# Patient Record
Sex: Male | Born: 1985 | Race: White | Hispanic: No | Marital: Single | Smoking: Never smoker
Health system: Southern US, Community
[De-identification: ages and names within clinical notes are randomized; demographics above are authoritative.]

## PROBLEM LIST (undated history)

## (undated) DIAGNOSIS — F419 Anxiety disorder, unspecified: Secondary | ICD-10-CM

## (undated) DIAGNOSIS — K5900 Constipation, unspecified: Secondary | ICD-10-CM

## (undated) DIAGNOSIS — F99 Mental disorder, not otherwise specified: Secondary | ICD-10-CM

## (undated) DIAGNOSIS — F79 Unspecified intellectual disabilities: Secondary | ICD-10-CM

## (undated) DIAGNOSIS — I10 Essential (primary) hypertension: Secondary | ICD-10-CM

## (undated) DIAGNOSIS — R0600 Dyspnea, unspecified: Secondary | ICD-10-CM

## (undated) DIAGNOSIS — K117 Disturbances of salivary secretion: Secondary | ICD-10-CM

## (undated) DIAGNOSIS — F988 Other specified behavioral and emotional disorders with onset usually occurring in childhood and adolescence: Secondary | ICD-10-CM

## (undated) DIAGNOSIS — R569 Unspecified convulsions: Secondary | ICD-10-CM

## (undated) DIAGNOSIS — F32A Depression, unspecified: Secondary | ICD-10-CM

## (undated) DIAGNOSIS — F410 Panic disorder [episodic paroxysmal anxiety] without agoraphobia: Secondary | ICD-10-CM

## (undated) DIAGNOSIS — F329 Major depressive disorder, single episode, unspecified: Secondary | ICD-10-CM

## (undated) HISTORY — PX: TONSILLECTOMY AND ADENOIDECTOMY: SHX28

## (undated) HISTORY — PX: TESTICLE SURGERY: SHX794

## (undated) HISTORY — PX: NO PAST SURGERIES: SHX2092

## (undated) HISTORY — PX: TONSILLECTOMY: SUR1361

## (undated) HISTORY — PX: BRAIN SURGERY: SHX531

---
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
## 2012-02-12 ENCOUNTER — Encounter (HOSPITAL_COMMUNITY)
Admission: RE | Admit: 2012-02-12 | Discharge: 2012-02-12 | Disposition: A | Discharge status: Home / Self Care | Payer: Medicaid Other | Source: Ambulatory Visit | Attending: Dentistry | Admitting: Dentistry

## 2012-02-12 ENCOUNTER — Encounter (HOSPITAL_COMMUNITY): Payer: Self-pay

## 2012-02-12 ENCOUNTER — Encounter (HOSPITAL_COMMUNITY): Payer: Self-pay | Admitting: Pharmacy Technician

## 2012-02-12 HISTORY — DX: Mental disorder, not otherwise specified: F99

## 2012-02-12 NOTE — Pre-Procedure Instructions (Signed)
22 Cameron Ibarra  02/12/2012   Your procedure is scheduled on:   Friday  02/22/12     Report to [facility] at 530 AM.  Call this number if you have problems the morning of surgery: [phone]   Remember:   Do not eat food:After Midnight.  May have clear liquids: up to 4 Hours before arrival.  Clear liquids include soda, tea, black coffee, apple or grape juice, broth.  Take these medicines the morning of surgery with A SIP OF WATER: CONCERTA, BUSPAR, DEPAKOTE,     Do not wear jewelry, make-up or nail polish.  Do not wear lotions, powders, or perfumes. You may wear deodorant.  Do not shave 48 hours prior to surgery.  Do not bring valuables to the hospital.  Contacts, dentures or bridgework may not be worn into surgery.  Leave suitcase in the car. After surgery it may be brought to your room.  For patients admitted to the hospital, checkout time is 11:00 AM the day of discharge.   Patients discharged the day of surgery will not be allowed to drive home.  Name and phone number of your driver: MOTHER   Special Instructions: CHG Shower Use Special Wash: 1/2 bottle night before surgery and 1/2 bottle morning of surgery.   Please read over the following fact sheets that you were given: Pain Booklet, Surgical Site Infection Prevention and Anesthesia Post-op Instructions

## 2012-02-13 NOTE — Consult Note (Signed)
Anesthesia:  Patient is a 26 year old male scheduled for dental restoration on 02/22/12.  His history includes mental retardation, ADHD, anxiety disorder, absence seizures, unspecified sleep disorder.  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 He was evaluated by Dr. Abelardo Diesel at [facility] and felt medically stable for this procedure.  Labs with sedation were recommended due to his history of being uncooperative.    Meds include Buspar, clonidine, Depakote, Concerta, and trazodone.  Dr. Magdalene Patricia also prescribed Xanax to be taken one hour before this procedure.  Will order a CBC, BMET for the day of surgery.  Anticipate labs will need to be drawn in holding once sedated.  Dr. Magdalene Patricia would also like for a lipid profile to be drawn with his pre-operative labs.  Will see if the nursing staff can work on getting an official order for this.

## 2012-02-14 ENCOUNTER — Encounter (HOSPITAL_COMMUNITY): Payer: Self-pay

## 2012-02-22 ENCOUNTER — Encounter (HOSPITAL_COMMUNITY): Payer: Self-pay | Admitting: Vascular Surgery

## 2012-02-22 ENCOUNTER — Ambulatory Visit (HOSPITAL_COMMUNITY): Payer: Medicaid Other | Admitting: Vascular Surgery

## 2012-02-22 ENCOUNTER — Encounter (HOSPITAL_COMMUNITY)
Admission: RE | Disposition: A | Discharge status: Home / Self Care | Payer: Self-pay | Source: Ambulatory Visit | Attending: Dentistry

## 2012-02-22 ENCOUNTER — Ambulatory Visit (HOSPITAL_COMMUNITY)
Admission: RE | Admit: 2012-02-22 | Discharge: 2012-02-22 | Disposition: A | Discharge status: Home / Self Care | Payer: Medicaid Other | Source: Ambulatory Visit | Attending: Dentistry | Admitting: Dentistry

## 2012-02-22 DIAGNOSIS — I10 Essential (primary) hypertension: Secondary | ICD-10-CM | POA: Insufficient documentation

## 2012-02-22 DIAGNOSIS — F79 Unspecified intellectual disabilities: Secondary | ICD-10-CM | POA: Insufficient documentation

## 2012-02-22 DIAGNOSIS — K029 Dental caries, unspecified: Secondary | ICD-10-CM | POA: Insufficient documentation

## 2012-02-22 DIAGNOSIS — R569 Unspecified convulsions: Secondary | ICD-10-CM | POA: Insufficient documentation

## 2012-02-22 DIAGNOSIS — F88 Other disorders of psychological development: Secondary | ICD-10-CM | POA: Insufficient documentation

## 2012-02-22 HISTORY — PX: TOOTH EXTRACTION: SHX859

## 2012-02-22 SURGERY — DENTAL RESTORATION/EXTRACTIONS
Anesthesia: General | Site: Mouth | Wound class: Clean Contaminated

## 2012-02-22 MED ORDER — FENTANYL CITRATE 0.05 MG/ML IJ SOLN
INTRAMUSCULAR | Status: DC | PRN
  Administered 2012-02-22: 100 ug via INTRAVENOUS
  Administered 2012-02-22: 50 ug via INTRAVENOUS

## 2012-02-22 MED ORDER — ONDANSETRON HCL 4 MG/2ML IJ SOLN
INTRAMUSCULAR | Status: DC | PRN
  Administered 2012-02-22: 4 mg via INTRAVENOUS

## 2012-02-22 MED ORDER — LIDOCAINE-EPINEPHRINE 2 %-1:100000 IJ SOLN
INTRAMUSCULAR | Status: DC | PRN
  Administered 2012-02-22: 20 mL

## 2012-02-22 MED ORDER — CHLORHEXIDINE GLUCONATE 0.12 % MT SOLN
473.0000 mL | OROMUCOSAL | Status: DC
  Filled 2012-02-22: qty 473

## 2012-02-22 MED ORDER — PROPOFOL 10 MG/ML IV EMUL
INTRAVENOUS | Status: DC | PRN
  Administered 2012-02-22: 200 mg via INTRAVENOUS

## 2012-02-22 MED ORDER — HEMOSTATIC AGENTS (NO CHARGE) OPTIME
TOPICAL | Status: DC | PRN
  Administered 2012-02-22: 1 via TOPICAL

## 2012-02-22 MED ORDER — OXYMETAZOLINE HCL 0.05 % NA SOLN
NASAL | Status: DC | PRN
  Administered 2012-02-22: 1 via NASAL

## 2012-02-22 MED ORDER — MIDAZOLAM HCL 2 MG/ML PO SYRP
20.0000 mg | ORAL_SOLUTION | Freq: Once | ORAL | Status: AC
  Administered 2012-02-22: 20 mg via ORAL

## 2012-02-22 MED ORDER — LACTATED RINGERS IV SOLN
INTRAVENOUS | Status: DC | PRN
  Administered 2012-02-22 (×2): via INTRAVENOUS

## 2012-02-22 MED ORDER — SUCCINYLCHOLINE CHLORIDE 20 MG/ML IJ SOLN
INTRAMUSCULAR | Status: DC | PRN
  Administered 2012-02-22: 120 mg via INTRAVENOUS

## 2012-02-22 MED ORDER — MIDAZOLAM HCL 2 MG/ML PO SYRP
ORAL_SOLUTION | ORAL | Status: AC
  Filled 2012-02-22: qty 10

## 2012-02-22 MED ORDER — DROPERIDOL 2.5 MG/ML IJ SOLN
0.6250 mg | INTRAMUSCULAR | Status: DC | PRN
  Administered 2012-02-22: 0.625 mg via INTRAVENOUS

## 2012-02-22 MED ORDER — GLYCOPYRROLATE 0.2 MG/ML IJ SOLN
INTRAMUSCULAR | Status: DC | PRN
  Administered 2012-02-22: 0.4 mg via INTRAVENOUS

## 2012-02-22 MED ORDER — KETAMINE HCL 100 MG/ML IJ SOLN
INTRAMUSCULAR | Status: AC
  Filled 2012-02-22: qty 1

## 2012-02-22 MED ORDER — NEOSTIGMINE METHYLSULFATE 1 MG/ML IJ SOLN
INTRAMUSCULAR | Status: DC | PRN
  Administered 2012-02-22: 3 mg via INTRAVENOUS

## 2012-02-22 MED ORDER — ROCURONIUM BROMIDE 100 MG/10ML IV SOLN
INTRAVENOUS | Status: DC | PRN
  Administered 2012-02-22: 10 mg via INTRAVENOUS
  Administered 2012-02-22: 30 mg via INTRAVENOUS

## 2012-02-22 MED ORDER — HYDROMORPHONE HCL PF 1 MG/ML IJ SOLN
0.2500 mg | INTRAMUSCULAR | Status: DC | PRN
  Administered 2012-02-22 (×2): 0.25 mg via INTRAVENOUS

## 2012-02-22 SURGICAL SUPPLY — 40 items
BLADE SURG 15 STRL LF DISP TIS (BLADE) IMPLANT
BLADE SURG 15 STRL SS (BLADE)
CANISTER SUCTION 2500CC (MISCELLANEOUS) ×2 IMPLANT
CLOTH BEACON ORANGE TIMEOUT ST (SAFETY) ×2 IMPLANT
CONT SPEC 4OZ CLIKSEAL STRL BL (MISCELLANEOUS) IMPLANT
COVER PROBE W GEL 5X96 (DRAPES) ×2 IMPLANT
COVER SURGICAL LIGHT HANDLE (MISCELLANEOUS) ×2 IMPLANT
COVER TABLE BACK 60X90 (DRAPES) ×2 IMPLANT
DECANTER SPIKE VIAL GLASS SM (MISCELLANEOUS) IMPLANT
DRAPE PROXIMA HALF (DRAPES) ×2 IMPLANT
ELECT COATED BLADE 2.86 ST (ELECTRODE) IMPLANT
ELECT REM PT RETURN 9FT ADLT (ELECTROSURGICAL) ×2
ELECTRODE REM PT RTRN 9FT ADLT (ELECTROSURGICAL) ×1 IMPLANT
GAUZE PACKING FOLDED 2  STR (GAUZE/BANDAGES/DRESSINGS) ×1
GAUZE PACKING FOLDED 2 STR (GAUZE/BANDAGES/DRESSINGS) ×1 IMPLANT
GAUZE SPONGE 2X2 8PLY STRL LF (GAUZE/BANDAGES/DRESSINGS) IMPLANT
GAUZE SPONGE 4X4 16PLY XRAY LF (GAUZE/BANDAGES/DRESSINGS) ×2 IMPLANT
GLOVE BIO SURGEON STRL SZ 6.5 (GLOVE) ×2 IMPLANT
GLOVE BIO SURGEON STRL SZ7.5 (GLOVE) ×2 IMPLANT
GOWN STRL NON-REIN LRG LVL3 (GOWN DISPOSABLE) ×4 IMPLANT
KIT BASIN OR (CUSTOM PROCEDURE TRAY) ×2 IMPLANT
KIT ROOM TURNOVER OR (KITS) ×2 IMPLANT
NEEDLE 27GAX1X1/2 (NEEDLE) IMPLANT
NEEDLE FILTER BLUNT 18X 1/2SAF (NEEDLE)
NEEDLE FILTER BLUNT 18X1 1/2 (NEEDLE) IMPLANT
NEEDLE HYPO 25GX1X1/2 BEV (NEEDLE) ×2 IMPLANT
PAD ARMBOARD 7.5X6 YLW CONV (MISCELLANEOUS) ×2 IMPLANT
PENCIL BUTTON HOLSTER BLD 10FT (ELECTRODE) ×2 IMPLANT
SPONGE GAUZE 2X2 STER 10/PKG (GAUZE/BANDAGES/DRESSINGS)
SPONGE GAUZE 4X4 12PLY (GAUZE/BANDAGES/DRESSINGS) IMPLANT
SPONGE SURGIFOAM ABS GEL 12-7 (HEMOSTASIS) ×2 IMPLANT
SPONGE SURGIFOAM ABS GEL SZ50 (HEMOSTASIS) IMPLANT
SUT CHROMIC 3 0 PS 2 (SUTURE) IMPLANT
SYR CONTROL 10ML LL (SYRINGE) ×2 IMPLANT
TOOTHBRUSH ADULT (PERSONAL CARE ITEMS) IMPLANT
TOWEL OR 17X24 6PK STRL BLUE (TOWEL DISPOSABLE) IMPLANT
TOWEL OR 17X26 10 PK STRL BLUE (TOWEL DISPOSABLE) ×2 IMPLANT
TUBE CONNECTING 12X1/4 (SUCTIONS) ×2 IMPLANT
WATER STERILE IRR 1000ML POUR (IV SOLUTION) ×4 IMPLANT
YANKAUER SUCT BULB TIP NO VENT (SUCTIONS) ×2 IMPLANT

## 2012-02-22 NOTE — H&P (Cosign Needed)
Cameron Ibarra is an 26 y.o. male.   Chief Complaint: Dental Caries HPI: Approved for surgery   Past Medical History  Diagnosis Date  . Mental disorder     add hyperactive    Past Surgical History  Procedure Date  . No past surgeries   . Brain surgery     CRANI RESHAPING AGE 53 MONTHS  . Testicle surgery   . Tonsillectomy and adenoidectomy     24 YRS  OLD   ONLY ADENOIDS    No family history on file. Social History:  does not have a smoking history on file. He has never used smokeless tobacco. He reports that he does not drink alcohol or use illicit drugs.  Allergies:  Allergies  Allergen Reactions  . Codeine Nausea And Vomiting    Medications Prior to Admission  Medication Dose Route Frequency Provider Last Rate Last Dose  . midazolam (VERSED) 2 MG/ML syrup 20 mg  20 mg Oral Once Remonia Richter, MD   20 mg at 02/22/12 0708  . midazolam (VERSED) 2 MG/ML syrup            Medications Prior to Admission  Medication Sig Dispense Refill  . busPIRone (BUSPAR) 10 MG tablet Take 20 mg by mouth 3 (three) times daily.      . cloNIDine (CATAPRES) 0.1 MG tablet Take 0.1 mg by mouth at bedtime.      . divalproex (DEPAKOTE) 250 MG DR tablet Take 250 mg by mouth 4 (four) times daily.      . methylphenidate (CONCERTA) 36 MG CR tablet Take 72 mg by mouth every morning.      . traZODone (DESYREL) 100 MG tablet Take 100 mg by mouth at bedtime.        No results found for this or any previous visit (from the past 48 hour(s)). No results found.  ROS  Blood pressure 110/71, pulse 68, temperature 98.3 F (36.8 C), temperature source Oral, resp. rate 68, SpO2 99.00%. Physical Exam   Assessment/Plan Approved for surgery  Cameron Ibarra,Cameron Ibarra 02/22/2012, 7:11 AM

## 2012-02-22 NOTE — Preoperative (Signed)
Beta Blockers   Reason not to administer Beta Blockers:Not Applicable 

## 2012-02-22 NOTE — Brief Op Note (Signed)
02/22/2012  11:57 AM  PATIENT:  Cameron Ibarra  26 y.o. male  PRE-OPERATIVE DIAGNOSIS:  DENTAL CARIES  POST-OPERATIVE DIAGNOSIS:  DENTAL CARIES  PROCEDURE:  Procedure(s) (LRB): DENTAL RESTORATION/EXTRACTIONS (N/A)  SURGEON:  Surgeon(s) and Role:    * Esaw Dace., DDS - Primary  PHYSICIAN ASSISTANT:   ASSISTANTS: none   ANESTHESIA:   general  EBL:  Total I/O In: 1600 [I.V.:1600] Out: -   BLOOD ADMINISTERED:none  DRAINS: none   LOCAL MEDICATIONS USED:  LIDOCAINE   SPECIMEN:  No Specimen  DISPOSITION OF SPECIMEN:  N/A  COUNTS:  YES  TOURNIQUET:  * No tourniquets in log *  DICTATION: .Other Dictation: Dictation Number   PLAN OF CARE: Discharge to home after PACU  PATIENT DISPOSITION:  PACU - hemodynamically stable.   Delay start of Pharmacological VTE agent (>24hrs) due to surgical blood loss or risk of bleeding: not applicable

## 2012-02-22 NOTE — H&P (Signed)
H&P Reviewed. Pt re-examined. No changes.  

## 2012-02-22 NOTE — Progress Notes (Signed)
Call to Dr. Krista Blue- reported med. History, medicine Profile, order rec'd to cancel request for CBC, BMET  But to continue with lipid panel if retrievable after pt. Has been given sedation. Order rec'd to administer Versed.

## 2012-02-22 NOTE — Brief Op Note (Signed)
02/22/2012  11:28 AM  PATIENT:  Cameron Ibarra  26 y.o. male  PRE-OPERATIVE DIAGNOSIS:  DENTAL CARIES  POST-OPERATIVE DIAGNOSIS:  DENTAL CARIES  PROCEDURE:  Procedure(s) (LRB): DENTAL RESTORATION/EXTRACTIONS (N/A)  SURGEON:  Surgeon(s) and Role:    * Terrilyn Saver., DDS - Primary  PHYSICIAN ASSISTANT:   ASSISTANTS: Laqueta Carina   ANESTHESIA:   general  EBL:  Total I/O In: 1600 [I.V.:1600] Out: -   BLOOD ADMINISTERED:none  DRAINS: none   LOCAL MEDICATIONS USED:  LIDOCAINE   SPECIMEN:  No Specimen  DISPOSITION OF SPECIMEN:  N/A  COUNTS:  YES  TOURNIQUET:  * No tourniquets in log *  DICTATION: .Other Dictation: Dictation Number Dictation completed  PLAN OF CARE: Discharge to home after PACU  PATIENT DISPOSITION:  Short Stay   Delay start of Pharmacological VTE agent (>24hrs) due to surgical blood loss or risk of bleeding: no

## 2012-02-22 NOTE — Transfer of Care (Signed)
Immediate Anesthesia Transfer of Care Note  Patient: Cameron Ibarra  Procedure(s) Performed: Procedure(s) (LRB): DENTAL RESTORATION/EXTRACTIONS (N/A)  Patient Location: PACU  Anesthesia Type: General  Level of Consciousness: awake  Airway & Oxygen Therapy: Patient Spontanous Breathing and Patient connected to nasal cannula oxygen  Post-op Assessment: Report given to PACU RN and Post -op Vital signs reviewed and stable  Post vital signs: Reviewed  Complications: No apparent anesthesia complications

## 2012-02-22 NOTE — Anesthesia Preprocedure Evaluation (Addendum)
Anesthesia Evaluation  Patient identified by MRN, date of birth, ID band Patient awake    Reviewed: Allergy & Precautions, H&P , NPO status , Patient's Chart, lab work & pertinent test results  History of Anesthesia Complications Negative for: history of anesthetic complications  Airway   Neck ROM: Full    Dental  (+) Poor Dentition and Dental Advisory Given   Pulmonary  breath sounds clear to auscultation        Cardiovascular hypertension, Pt. on medications Rhythm:Regular Rate:Normal     Neuro/Psych Seizures -, Well Controlled,  PSYCHIATRIC DISORDERS    GI/Hepatic   Endo/Other    Renal/GU      Musculoskeletal   Abdominal   Peds  Hematology   Anesthesia Other Findings   Reproductive/Obstetrics                          Anesthesia Physical Anesthesia Plan  ASA: III  Anesthesia Plan: General   Post-op Pain Management:    Induction: Intravenous  Airway Management Planned: Nasal ETT  Additional Equipment:   Intra-op Plan:   Post-operative Plan: Extubation in OR  Informed Consent: I have reviewed the patients History and Physical, chart, labs and discussed the procedure including the risks, benefits and alternatives for the proposed anesthesia with the patient or authorized representative who has indicated his/her understanding and acceptance.   Dental advisory given  Plan Discussed with: CRNA, Anesthesiologist and Surgeon  Anesthesia Plan Comments:         Anesthesia Quick Evaluation

## 2012-02-22 NOTE — Anesthesia Postprocedure Evaluation (Signed)
Anesthesia Post Note  Patient: Cameron Ibarra  Procedure(s) Performed: Procedure(s) (LRB): DENTAL RESTORATION/EXTRACTIONS (N/A)  Anesthesia type: general  Patient location: PACU  Post pain: Pain level controlled  Post assessment: Patient's Cardiovascular Status Stable  Last Vitals:  Filed Vitals:   02/22/12 1243  BP:   Pulse: 96  Temp: 37.5 C  Resp: 21    Post vital signs: Reviewed and stable  Level of consciousness: sedated  Complications: No apparent anesthesia complications

## 2012-02-22 NOTE — Op Note (Signed)
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  Dental care provided in the operating room for medically necessary treatment.  OPERATION:  Full mouth rehabilitation including exam, x-rays, cleaning, operative and crowning bridge.  SURGEON:  Azucena Freed, DDS.  ASSISTANT:  Laqueta Carina and hospital staff.  ANESTHESIA:  General.  PROCEDURE:  The patient was brought in to the operating room and placed in the supine position.  General anesthesia was administered via nasal intubation.  The patient was prepped and draped in the usual manner for an intraoral general dentistry procedure.  The oropharynx was suctioned and a moistened posterior throat pack was placed.  A full intraoral exam including all hard and soft tissues was performed.  This was a recall exam.  A soft tissue exam reveals floor of the mouth, buccal mucosa, soft palate, hard palate, tongue, gingiva, and frenum attachments all within normal limits with regard to size, color, and consistency.  Hard tissue exam reveals tooth #1 missing, #2 through 15 present, #16 and 17 missing, #18 through 31 present, #32 missing.  A full mouth series of digital x-rays was taken, full mouth debridement was completed. Operative care was provided with a high-speed handpiece #557 and #4 bur and a slow-speed handpiece #4 bur.  Composites were completed on #3 facial, 5 facial, 6 mesial facial distal, 8 distal facial lingual, 9 mesial, 10 mesial facial distal, 12 facial, 23 mesial facial distal lingual, 24 mesial facial distal lingual.  An amalgam was  completed on #3, AMOF.  Impressions were made for a porcelain fused to metal crown on #23, 24, and 25.  Estimated blood loss of 20 mL.  8 mL of 2% lidocaine with 1:100,000 epinephrine was given.  Mouth was suctioned dry and a posterior throat pack was carefully removed with constant suction.  The patient was awakened in the OR and transferred to the recovery room in good condition.     Esaw Dace., D.D.S.     WEM/MEDQ  D:  02/22/2012  T:  02/22/2012  Job:  [id number]

## 2012-02-22 NOTE — Anesthesia Procedure Notes (Addendum)
Procedure Name: Intubation Date/Time: 02/22/2012 7:50 AM Performed by: Lovie Chol Pre-anesthesia Checklist: Patient identified, Emergency Drugs available, Suction available and Patient being monitored Patient Re-evaluated:Patient Re-evaluated prior to inductionOxygen Delivery Method: Circle system utilized Preoxygenation: Pre-oxygenation with 100% oxygen Intubation Type: Inhalational induction Ventilation: Mask ventilation without difficulty and Oral airway inserted - appropriate to patient size Laryngoscope Size: Miller and 2 Grade View: Grade II Nasal Tubes: Right and Nasal Rae Tube size: 7.0 mm Number of attempts: 2 Placement Confirmation: ETT inserted through vocal cords under direct vision,  positive ETCO2 and breath sounds checked- equal and bilateral Secured at: 27 cm Tube secured with: Tape Dental Injury: Teeth and Oropharynx as per pre-operative assessment  Comments: DL x 1; MAC 4; grade 4 view

## 2012-02-25 ENCOUNTER — Encounter (HOSPITAL_COMMUNITY): Payer: Self-pay | Admitting: Dentistry

## 2013-07-23 ENCOUNTER — Other Ambulatory Visit: Payer: Self-pay | Admitting: Dentistry

## 2013-07-30 ENCOUNTER — Encounter (HOSPITAL_COMMUNITY)
Admission: RE | Admit: 2013-07-30 | Discharge: 2013-07-30 | Disposition: A | Discharge status: Home / Self Care | Payer: Medicare Other | Source: Ambulatory Visit | Attending: Anesthesiology | Admitting: Anesthesiology

## 2013-07-30 ENCOUNTER — Encounter (HOSPITAL_COMMUNITY)
Admission: RE | Admit: 2013-07-30 | Discharge: 2013-07-30 | Disposition: A | Discharge status: Home / Self Care | Payer: Medicare Other | Source: Ambulatory Visit | Attending: Dentistry | Admitting: Dentistry

## 2013-07-30 ENCOUNTER — Encounter (HOSPITAL_COMMUNITY): Payer: Self-pay

## 2013-07-30 DIAGNOSIS — Z01818 Encounter for other preprocedural examination: Secondary | ICD-10-CM | POA: Insufficient documentation

## 2013-07-30 DIAGNOSIS — Z01810 Encounter for preprocedural cardiovascular examination: Secondary | ICD-10-CM | POA: Insufficient documentation

## 2013-07-30 HISTORY — DX: Unspecified convulsions: R56.9

## 2013-07-30 HISTORY — DX: Essential (primary) hypertension: I10

## 2013-07-30 NOTE — Progress Notes (Signed)
Pt is not currently under the care of a cardiologist. Pt mother denies that pt had an EKG and chest x ray within the last year. Pt mother also denies that the pt had an echo, stress test, and cardiac catheterization. Pt H&P forms were incomplete so pt advised to have forms filled out prior to the surgery date of 08/06/13. Pt mother was given the fax number to PAT to fax forms once completed. Pt labs are due DOS because of pt anxiety and mothers request to medicate pt prior to obtaining specimens. Revonda Standard, PA ( anesthesia) advised that it was okay for pt to take methylphenidate (RITALIN) 20 MG tablet and methylphenidate (CONCERTA) 36 MG CR tablet on morning of surgery.Marland Kitchen

## 2013-07-30 NOTE — Pre-Procedure Instructions (Addendum)
AYDRIEN FROMAN  07/30/2013   Your procedure is scheduled on:  Thursday, August 06, 2013  Report to [facility] at 5:30 AM.  Call this number if you have problems the morning of surgery: [phone]   Remember:   Do not eat food or drink liquids after midnight.   Take these medicines the morning of surgery with A SIP OF WATER: busPIRone (BUSPAR) 10 MG tablet, divalproex (DEPAKOTE) 250 MG DR tablet, methylphenidate (CONCERTA) 36 MG CR tablet ,methylphenidate (RITALIN) 20 MG tablet   Stop taking and herbal medications. Do not take any NSAIDs ie: Ibuprofen, Advil, Naproxen or any medication containing Aspirin.  Do not wear jewelry, make-up or nail polish.  Do not wear lotions, powders, or perfumes. You may wear deodorant.  Do not shave 48 hours prior to surgery. Men may shave face and neck.  Do not bring valuables to the hospital.  [facility] is not responsible for any belongings or valuables.  Contacts, dentures or bridgework may not be worn into surgery.  Leave suitcase in the car. After surgery it may be brought to your room.  For patients admitted to the hospital, checkout time is 11:00 AM the day of discharge.   Patients discharged the day of surgery will not be allowed to drive home.  Name and phone number of your driver:   Special Instructions: Shower using CHG 2 nights before surgery and the night before surgery.  If you shower the day of surgery use CHG.  Use special wash - you have one bottle of CHG for all showers.  You should use approximately 1/3 of the bottle for each shower.   Please read over the following fact sheets that you were given: Pain Booklet, Coughing and Deep Breathing and Surgical Site Infection Prevention

## 2013-08-04 NOTE — Progress Notes (Signed)
Spoke with West Bali at Dr.Miller's about the need for an H&P and surgery is for 08/06/13.Per West Bali pts mom is getting this today and then it will be faxed

## 2013-08-05 NOTE — Progress Notes (Signed)
Spoke with West Bali at Dr.Miller's office to again request H&P.States the mom was supposed to get it yesterday and fax it.West Bali will call the mom and see what's going on

## 2013-08-06 ENCOUNTER — Ambulatory Visit (HOSPITAL_COMMUNITY)
Admission: RE | Admit: 2013-08-06 | Discharge: 2013-08-06 | Disposition: A | Discharge status: Home / Self Care | Payer: Medicare Other | Source: Ambulatory Visit | Attending: Dentistry | Admitting: Dentistry

## 2013-08-06 ENCOUNTER — Encounter (HOSPITAL_COMMUNITY): Payer: Self-pay | Admitting: *Deleted

## 2013-08-06 ENCOUNTER — Encounter (HOSPITAL_COMMUNITY)
Admission: RE | Disposition: A | Discharge status: Home / Self Care | Payer: Self-pay | Source: Ambulatory Visit | Attending: Dentistry

## 2013-08-06 ENCOUNTER — Encounter (HOSPITAL_COMMUNITY): Payer: Self-pay | Admitting: Vascular Surgery

## 2013-08-06 ENCOUNTER — Ambulatory Visit (HOSPITAL_COMMUNITY): Payer: Medicare Other | Admitting: Anesthesiology

## 2013-08-06 DIAGNOSIS — G40802 Other epilepsy, not intractable, without status epilepticus: Secondary | ICD-10-CM | POA: Insufficient documentation

## 2013-08-06 DIAGNOSIS — Z79899 Other long term (current) drug therapy: Secondary | ICD-10-CM | POA: Diagnosis not present

## 2013-08-06 DIAGNOSIS — F79 Unspecified intellectual disabilities: Secondary | ICD-10-CM | POA: Diagnosis not present

## 2013-08-06 DIAGNOSIS — Z885 Allergy status to narcotic agent status: Secondary | ICD-10-CM | POA: Insufficient documentation

## 2013-08-06 DIAGNOSIS — K029 Dental caries, unspecified: Secondary | ICD-10-CM | POA: Diagnosis not present

## 2013-08-06 DIAGNOSIS — G479 Sleep disorder, unspecified: Secondary | ICD-10-CM | POA: Insufficient documentation

## 2013-08-06 DIAGNOSIS — F988 Other specified behavioral and emotional disorders with onset usually occurring in childhood and adolescence: Secondary | ICD-10-CM | POA: Diagnosis not present

## 2013-08-06 DIAGNOSIS — F411 Generalized anxiety disorder: Secondary | ICD-10-CM | POA: Insufficient documentation

## 2013-08-06 HISTORY — PX: DENTAL RESTORATION/EXTRACTION WITH X-RAY: SHX5796

## 2013-08-06 SURGERY — DENTAL RESTORATION/EXTRACTION WITH X-RAY
Anesthesia: General | Site: Mouth | Wound class: Clean Contaminated

## 2013-08-06 MED ORDER — GLYCOPYRROLATE 0.2 MG/ML IJ SOLN
INTRAMUSCULAR | Status: DC | PRN
  Administered 2013-08-06: .4 mg via INTRAVENOUS

## 2013-08-06 MED ORDER — KETOROLAC TROMETHAMINE 30 MG/ML IJ SOLN
INTRAMUSCULAR | Status: DC | PRN
  Administered 2013-08-06: 30 mg via INTRAVENOUS

## 2013-08-06 MED ORDER — HYDROMORPHONE HCL PF 1 MG/ML IJ SOLN
INTRAMUSCULAR | Status: AC
  Administered 2013-08-06: 0.5 mg via INTRAVENOUS
  Filled 2013-08-06: qty 1

## 2013-08-06 MED ORDER — LACTATED RINGERS IV SOLN
INTRAVENOUS | Status: DC | PRN
  Administered 2013-08-06: 08:00:00 via INTRAVENOUS

## 2013-08-06 MED ORDER — KETAMINE HCL 100 MG/ML IJ SOLN
INTRAMUSCULAR | Status: AC
  Filled 2013-08-06: qty 1

## 2013-08-06 MED ORDER — NEOSTIGMINE METHYLSULFATE 1 MG/ML IJ SOLN
INTRAMUSCULAR | Status: DC | PRN
  Administered 2013-08-06: 2 mg via INTRAVENOUS

## 2013-08-06 MED ORDER — ONDANSETRON HCL 4 MG/2ML IJ SOLN
4.0000 mg | Freq: Once | INTRAMUSCULAR | Status: AC | PRN
  Administered 2013-08-06: 4 mg via INTRAVENOUS

## 2013-08-06 MED ORDER — HYDROMORPHONE HCL PF 1 MG/ML IJ SOLN
0.2500 mg | INTRAMUSCULAR | Status: DC | PRN
  Administered 2013-08-06: 0.5 mg via INTRAVENOUS

## 2013-08-06 MED ORDER — PROPOFOL 10 MG/ML IV BOLUS
INTRAVENOUS | Status: DC | PRN
  Administered 2013-08-06: 100 mg via INTRAVENOUS

## 2013-08-06 MED ORDER — ONDANSETRON HCL 4 MG/2ML IJ SOLN
INTRAMUSCULAR | Status: AC
  Filled 2013-08-06: qty 2

## 2013-08-06 MED ORDER — ONDANSETRON HCL 4 MG/2ML IJ SOLN
INTRAMUSCULAR | Status: DC | PRN
  Administered 2013-08-06: 4 mg via INTRAVENOUS

## 2013-08-06 MED ORDER — OXYMETAZOLINE HCL 0.05 % NA SOLN
NASAL | Status: AC
  Filled 2013-08-06: qty 15

## 2013-08-06 MED ORDER — ROCURONIUM BROMIDE 100 MG/10ML IV SOLN
INTRAVENOUS | Status: DC | PRN
  Administered 2013-08-06 (×3): 20 mg via INTRAVENOUS
  Administered 2013-08-06: 50 mg via INTRAVENOUS
  Administered 2013-08-06: 20 mg via INTRAVENOUS

## 2013-08-06 MED ORDER — LIDOCAINE-EPINEPHRINE 2 %-1:100000 IJ SOLN
INTRAMUSCULAR | Status: AC
  Filled 2013-08-06: qty 1

## 2013-08-06 MED ORDER — HEMOSTATIC AGENTS (NO CHARGE) OPTIME
TOPICAL | Status: DC | PRN
  Administered 2013-08-06: 1 via TOPICAL

## 2013-08-06 MED ORDER — OXYMETAZOLINE HCL 0.05 % NA SOLN
NASAL | Status: DC | PRN
  Administered 2013-08-06: 1 via NASAL

## 2013-08-06 MED ORDER — FENTANYL CITRATE 0.05 MG/ML IJ SOLN
INTRAMUSCULAR | Status: DC | PRN
  Administered 2013-08-06: 50 ug via INTRAVENOUS
  Administered 2013-08-06: 100 ug via INTRAVENOUS
  Administered 2013-08-06 (×3): 50 ug via INTRAVENOUS

## 2013-08-06 MED ORDER — KETAMINE HCL 100 MG/ML IJ SOLN
INTRAMUSCULAR | Status: DC | PRN
  Administered 2013-08-06: 400 mg via INTRAVENOUS

## 2013-08-06 MED ORDER — LIDOCAINE-EPINEPHRINE 2 %-1:100000 IJ SOLN
INTRAMUSCULAR | Status: DC | PRN
  Administered 2013-08-06: 20 mL

## 2013-08-06 SURGICAL SUPPLY — 39 items
BLADE SURG 15 STRL LF DISP TIS (BLADE) IMPLANT
BLADE SURG 15 STRL SS (BLADE)
CANISTER SUCTION 2500CC (MISCELLANEOUS) ×2 IMPLANT
CLOTH BEACON ORANGE TIMEOUT ST (SAFETY) ×2 IMPLANT
CONT SPEC 4OZ CLIKSEAL STRL BL (MISCELLANEOUS) IMPLANT
COVER PROBE W GEL 5X96 (DRAPES) ×2 IMPLANT
COVER SURGICAL LIGHT HANDLE (MISCELLANEOUS) ×2 IMPLANT
COVER TABLE BACK 60X90 (DRAPES) ×2 IMPLANT
DECANTER SPIKE VIAL GLASS SM (MISCELLANEOUS) ×2 IMPLANT
DRAPE PROXIMA HALF (DRAPES) ×2 IMPLANT
ELECT COATED BLADE 2.86 ST (ELECTRODE) ×2 IMPLANT
ELECT REM PT RETURN 9FT ADLT (ELECTROSURGICAL) ×2
ELECTRODE REM PT RTRN 9FT ADLT (ELECTROSURGICAL) ×1 IMPLANT
GAUZE PACKING FOLDED 2  STR (GAUZE/BANDAGES/DRESSINGS)
GAUZE PACKING FOLDED 2 STR (GAUZE/BANDAGES/DRESSINGS) IMPLANT
GAUZE SPONGE 2X2 8PLY STRL LF (GAUZE/BANDAGES/DRESSINGS) IMPLANT
GAUZE SPONGE 4X4 16PLY XRAY LF (GAUZE/BANDAGES/DRESSINGS) ×2 IMPLANT
GLOVE BIO SURGEON STRL SZ7.5 (GLOVE) ×4 IMPLANT
GLOVE SURG SS PI 6.5 STRL IVOR (GLOVE) ×4 IMPLANT
GOWN STRL NON-REIN LRG LVL3 (GOWN DISPOSABLE) ×4 IMPLANT
KIT BASIN OR (CUSTOM PROCEDURE TRAY) ×2 IMPLANT
KIT ROOM TURNOVER OR (KITS) ×2 IMPLANT
NEEDLE 27GAX1X1/2 (NEEDLE) ×2 IMPLANT
NEEDLE FILTER BLUNT 18X 1/2SAF (NEEDLE)
NEEDLE FILTER BLUNT 18X1 1/2 (NEEDLE) IMPLANT
PAD ARMBOARD 7.5X6 YLW CONV (MISCELLANEOUS) ×4 IMPLANT
PENCIL BUTTON HOLSTER BLD 10FT (ELECTRODE) ×2 IMPLANT
SPONGE GAUZE 2X2 STER 10/PKG (GAUZE/BANDAGES/DRESSINGS)
SPONGE GAUZE 4X4 12PLY (GAUZE/BANDAGES/DRESSINGS) IMPLANT
SPONGE SURGIFOAM ABS GEL 12-7 (HEMOSTASIS) IMPLANT
SPONGE SURGIFOAM ABS GEL SZ50 (HEMOSTASIS) ×2 IMPLANT
SUT CHROMIC 3 0 PS 2 (SUTURE) ×2 IMPLANT
SYR CONTROL 10ML LL (SYRINGE) ×2 IMPLANT
TOOTHBRUSH ADULT (PERSONAL CARE ITEMS) IMPLANT
TOWEL OR 17X24 6PK STRL BLUE (TOWEL DISPOSABLE) ×2 IMPLANT
TOWEL OR 17X26 10 PK STRL BLUE (TOWEL DISPOSABLE) ×2 IMPLANT
TUBE CONNECTING 12X1/4 (SUCTIONS) ×2 IMPLANT
WATER STERILE IRR 1000ML POUR (IV SOLUTION) ×4 IMPLANT
YANKAUER SUCT BULB TIP NO VENT (SUCTIONS) ×2 IMPLANT

## 2013-08-06 NOTE — Brief Op Note (Signed)
08/06/2013  1:27 PM  PATIENT:  Cameron Ibarra  27 y.o. male  PRE-OPERATIVE DIAGNOSIS:  DENTAL CARRIES  POST-OPERATIVE DIAGNOSIS:  DENTAL CARRIES  PROCEDURE:  Procedure(s): Full mouth x-rays, No extractions,Recall Exam,Full mouth debridement (N/A)  SURGEON:  Snoneurgeon(s) and Role:    * Esaw Dace., DDS - Primary  PHYSICIAN ASSISTANT:   ASSISTANTS:  Derry Lory  ANESTHESIA:   general  EBL:  Total I/O In: 800 [I.V.:800] Out: 15 [Blood:15]  BLOOD ADMINISTERED:none  DRAINS: none   LOCAL MEDICATIONS USED:  LIDOCAINE   SPECIMEN:  No Specimen  DISPOSITION OF SPECIMEN:  N/A  COUNTS:  YES  TOURNIQUET:  * No tourniquets in log *  DICTATION: .Note written in EPIC  PLAN OF CARE: Discharge to home after PACU  PATIENT DISPOSITION:  PACU - hemodynamically stable.   Delay start of Pharmacological VTE agent (>24hrs) due to surgical blood loss or risk of bleeding: not applicable

## 2013-08-06 NOTE — Transfer of Care (Signed)
Immediate Anesthesia Transfer of Care Note  Patient: Cameron Ibarra  Procedure(s) Performed: Procedure(s): Full mouth x-rays, No extractions,Recall Exam,Full mouth debridement (N/A)  Patient Location: PACU  Anesthesia Type:General  Level of Consciousness: sedated  Airway & Oxygen Therapy: Patient Spontanous Breathing and Patient connected to face mask oxygen  Post-op Assessment: Report given to PACU RN and Post -op Vital signs reviewed and stable  Post vital signs: Reviewed and stable  Complications: No apparent anesthesia complications

## 2013-08-06 NOTE — Progress Notes (Signed)
Pt examined, no change in health status. OK for anesthesia.  Kipp Brood, MD

## 2013-08-06 NOTE — Preoperative (Signed)
Beta Blockers   Reason not to administer Beta Blockers:Not Applicable 

## 2013-08-06 NOTE — Anesthesia Preprocedure Evaluation (Addendum)
Anesthesia Evaluation  Patient identified by MRN, date of birth, ID band Patient awake    Reviewed: Allergy & Precautions, H&P , NPO status   History of Anesthesia Complications Negative for: history of anesthetic complications  Airway Mallampati: II      Dental  (+) Teeth Intact, Poor Dentition and Dental Advisory Given   Pulmonary  breath sounds clear to auscultation        Cardiovascular hypertension, Rhythm:Regular Rate:Normal     Neuro/Psych Seizures -, Well Controlled,     GI/Hepatic negative GI ROS, Neg liver ROS,   Endo/Other  negative endocrine ROS  Renal/GU negative Renal ROS     Musculoskeletal   Abdominal   Peds  Hematology negative hematology ROS (+)   Anesthesia Other Findings   Reproductive/Obstetrics                          Anesthesia Physical Anesthesia Plan  ASA: III  Anesthesia Plan: General   Post-op Pain Management:    Induction: Intravenous  Airway Management Planned: Nasal ETT  Additional Equipment:   Intra-op Plan:   Post-operative Plan:   Informed Consent: I have reviewed the patients History and Physical, chart, labs and discussed the procedure including the risks, benefits and alternatives for the proposed anesthesia with the patient or authorized representative who has indicated his/her understanding and acceptance.   Dental advisory given  Plan Discussed with: CRNA and Anesthesiologist  Anesthesia Plan Comments: (Developmental Delay, H/O craniosynostosis Seizure disorder Poor dentition  Plan Ketamine pre-med with inhalational induction  Chinita Greenland)        Anesthesia Quick Evaluation

## 2013-08-06 NOTE — Progress Notes (Signed)
Per mother cannot draw labs until pt. Asleep,special needs pt.

## 2013-08-06 NOTE — H&P (Signed)
No Changes

## 2013-08-06 NOTE — Op Note (Signed)
Recall Exam, FMX, FMD, Amalgams (#3 MODLF, 4 MOD, 5 DO, 13 MO, 21 MO, 29 DO), Composites (#8 MIDLF, 10 F), Glass Ionmer (#18 F, #22 DF, 28 MFD), Crown Preps #22, 23, 6 cc 2% Lidocaine with 1:100,000 epi., 30 mg Toradol post surgery

## 2013-08-06 NOTE — Anesthesia Postprocedure Evaluation (Signed)
  Anesthesia Post-op Note  Patient: Cameron Ibarra  Procedure(s) Performed: Procedure(s): Full mouth x-rays, No extractions,Recall Exam,Full mouth debridement (N/A)  Patient Location: PACU  Anesthesia Type:General  Level of Consciousness: awake and alert   Airway and Oxygen Therapy: Patient Spontanous Breathing  Post-op Pain: mild  Post-op Assessment: Post-op Vital signs reviewed, Patient's Cardiovascular Status Stable, Respiratory Function Stable, Patent Airway and Pain level controlled  Post-op Vital Signs: stable  Complications: No apparent anesthesia complications

## 2013-08-06 NOTE — Brief Op Note (Signed)
08/06/2013  1:22 PM  PATIENT:  Kaylyn Layer  27 y.o. male  PRE-OPERATIVE DIAGNOSIS:  DENTAL CARRIES  POST-OPERATIVE DIAGNOSIS:  DENTAL CARRIES  PROCEDURE:  Procedure(s): Full mouth x-rays, No extractions,Recall Exam,Full mouth debridement (N/A), Composites, Glass Ionomers, Amalgams, Crown Impressions  SURGEON:  Surgeon(s) and Role:    * Esaw Dace., DDS - Primary  PHYSICIAN ASSISTANT:   ASSISTANTS: Derry Lory   ANESTHESIA:   general  EBL:  Total I/O In: 800 [I.V.:800] Out: 15 [Blood:15]  BLOOD ADMINISTERED:none  DRAINS: none   LOCAL MEDICATIONS USED:  LIDOCAINE   SPECIMEN:  none  DISPOSITION OF SPECIMEN:  N/A  COUNTS:  YES  TOURNIQUET:  * No tourniquets in log *  DICTATION: .Note written in EPIC  PLAN OF CARE: Discharge to home after PACU  PATIENT DISPOSITION:  PACU - hemodynamically stable.   Delay start of Pharmacological VTE agent (>24hrs) due to surgical blood loss or risk of bleeding: not applicable

## 2013-08-06 NOTE — Anesthesia Procedure Notes (Signed)
Procedure Name: Intubation Date/Time: 08/06/2013 8:04 AM Performed by: Armandina Gemma Pre-anesthesia Checklist: Patient identified, Timeout performed, Emergency Drugs available, Suction available and Patient being monitored Patient Re-evaluated:Patient Re-evaluated prior to inductionOxygen Delivery Method: Circle system utilized Preoxygenation: Pre-oxygenation with 100% oxygen Intubation Type: Combination inhalational/ intravenous induction Ventilation: Mask ventilation without difficulty and Oral airway inserted - appropriate to patient size Laryngoscope Size: Hyacinth Meeker and 2 Grade View: Grade II Nasal Tubes: Right, Nasal Rae, Magill forceps- large, utilized and Nasal prep performed Tube size: 7.5 mm Number of attempts: 1 Placement Confirmation: ETT inserted through vocal cords under direct vision,  breath sounds checked- equal and bilateral and positive ETCO2 Secured at: 29 cm Tube secured with: Tape Dental Injury: Teeth and Oropharynx as per pre-operative assessment  Comments: Ketamine IM- then N2O mask induction- SEvo then added- IV Joslin- Dip/roc IV- 7.5 nasal rae advanced by Joslin R nare- thru cords with Magills- + ETCO2 + BS Bilat- atraumatic teeth and mouth as preop- forehead padded.  PT HAS VERY LIMITED MOUTH OPENING EVEN AFTER MUSCLE RELAXATION.

## 2013-08-07 ENCOUNTER — Encounter (HOSPITAL_COMMUNITY): Payer: Self-pay | Admitting: Dentistry

## 2013-08-11 NOTE — Op Note (Signed)
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  Dental care provided in the OR for medically necessary treatment.  OPERATION:  Full mouth oral rehabilitation including exam, x-rays, cleaning, and operative care.  SURGEON:  Azucena Freed, D.D.S.  ASSISTANT:  Lorre Munroe and hospital staff.  ANESTHESIA:  General.  PROCEDURE IN DETAIL:  The patient was brought into the operating room and placed in the supine position.  General anesthesia was administered via nasal intubation.  The patient was prepped and draped in the usual manner for an intraoral general dentistry procedure.  Oropharynx was suctioned and was moistened.  Posterior throat pack was placed.  A full intraoral exam including all hard and soft tissues was performed.  This was a recall exam.  Soft tissue exam reveals the floor of the mouth, buccal mucosa, soft palate, hard palate, tongue, gingiva, and frenum attachments all within normal limits with regard to size, color, and consistency.  The hard tissue exam reveals #2 present tooth through 15 present, #18 through 31 present.  Full mouth series and digital x-rays was taken.  Full mouth debridement was completed.  Operative care was provided with high-speed handpiece and #557 and #4 bur, and a slow speed handpiece and #4.  Glass ionomers were completed on #18 F and 22 DF, and #28 MF.  The composites were completed on #8 MIDLF, #10 F.  Amalgams were completed on #3 MODLF, 4 MOD, 5 DO, 13 MO, 21 MO, 29 DO, and crown preps were completed on #26 and 27 to be delivered at a later date.  A  6 mL 2% lidocaine with 1:100,000 epinephrine was given.  Estimated blood loss of 10 mL.  The mouth was suctioned dry and a posterior throat pack was carefully removed with constant suction.  The patient was awakened in the OR, and transferred to the recovery room in good condition.     Esaw Dace., D.D.S.     WEM/MEDQ  D:  08/11/2013  T:  08/11/2013  Job:  [id number]

## 2014-08-31 IMAGING — CR DG CHEST 2V
2 series · 2 of 2 positions shown · non-contrast
Comparison: None.

CLINICAL DATA: Hypertension.  The patient is preoperative for
dental surgery.

CHEST - 2 VIEW

[w chest pa]
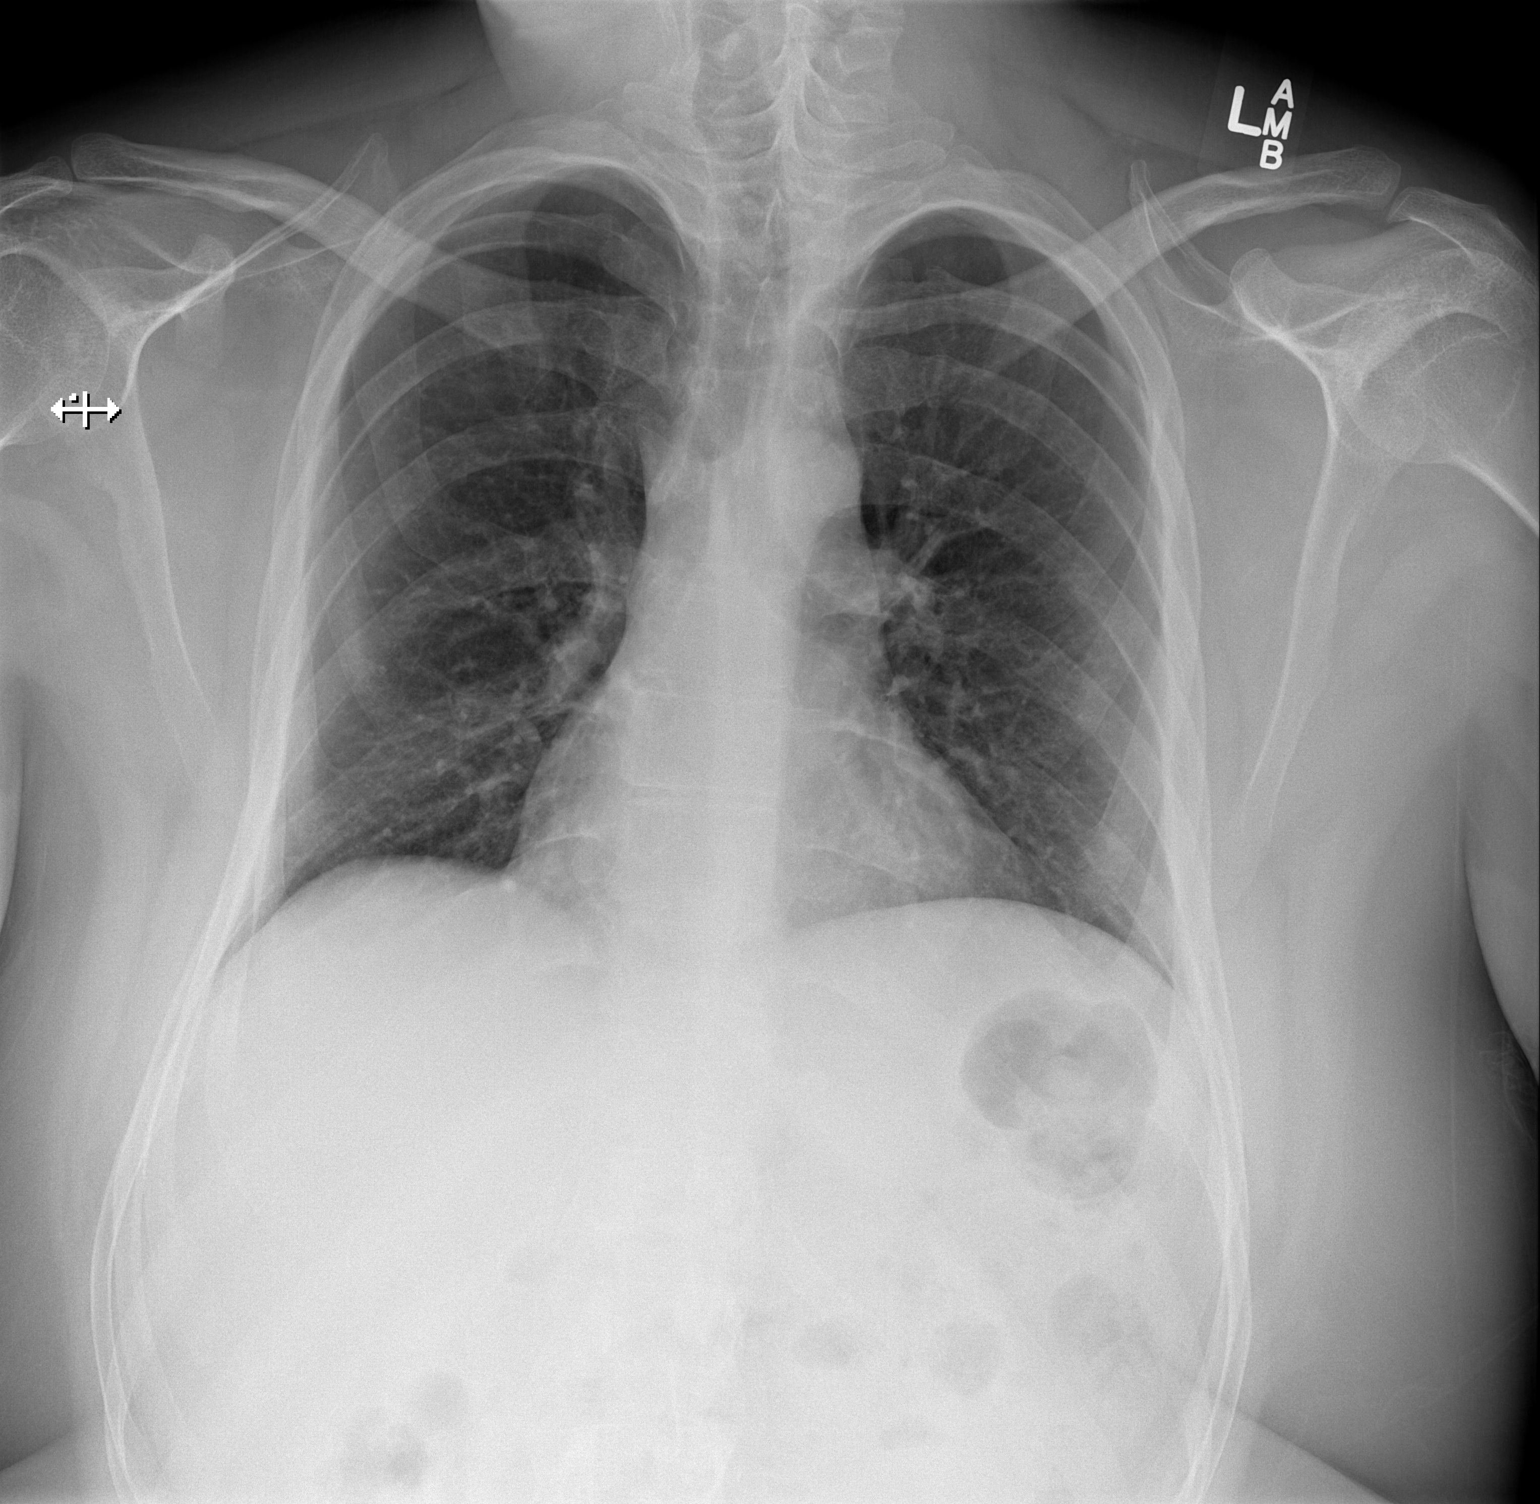

[w chest lat]
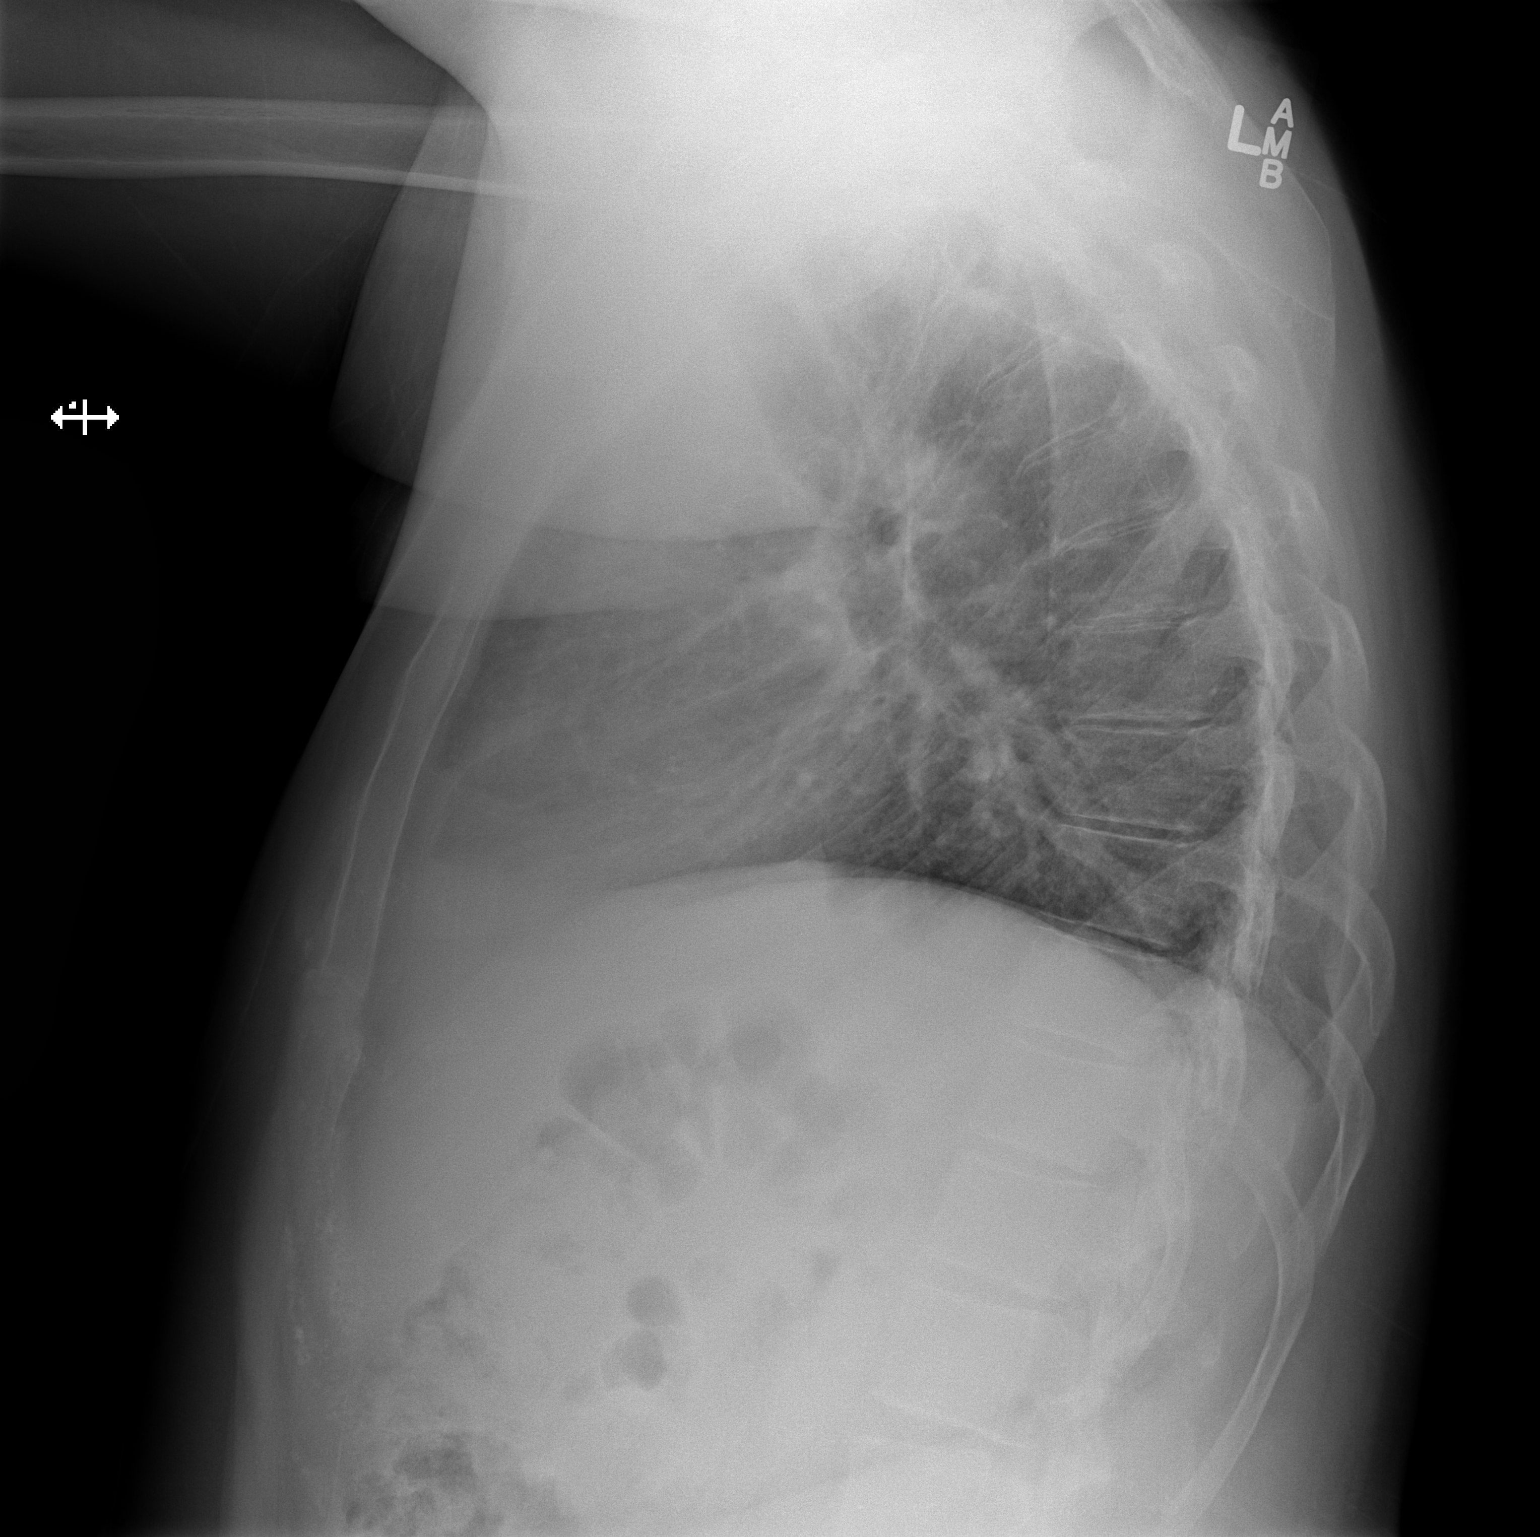

[2 of 2 positions shown; findings below may reference images not displayed]

FINDINGS: There is no focal infiltrate, pulmonary edema, or pleural
effusion.  The mediastinal contour and cardiac silhouette are
normal.  The soft tissues and osseous structures are normal.
IMPRESSION: No acute cardiopulmonary disease identified.

## 2020-03-25 ENCOUNTER — Ambulatory Visit: Payer: Self-pay | Admitting: Dentistry

## 2020-03-25 ENCOUNTER — Other Ambulatory Visit (HOSPITAL_COMMUNITY)
Admission: RE | Admit: 2020-03-25 | Discharge: 2020-03-25 | Disposition: A | Discharge status: Home / Self Care | Payer: Medicare Other | Source: Ambulatory Visit | Attending: Dentistry | Admitting: Dentistry

## 2020-03-25 DIAGNOSIS — Z01812 Encounter for preprocedural laboratory examination: Secondary | ICD-10-CM | POA: Insufficient documentation

## 2020-03-25 DIAGNOSIS — Z20822 Contact with and (suspected) exposure to covid-19: Secondary | ICD-10-CM | POA: Diagnosis not present

## 2020-03-25 LAB — SARS CORONAVIRUS 2 (TAT 6-24 HRS): SARS Coronavirus 2: NEGATIVE

## 2020-03-28 ENCOUNTER — Encounter (HOSPITAL_COMMUNITY): Payer: Self-pay | Admitting: Vascular Surgery

## 2020-03-28 ENCOUNTER — Other Ambulatory Visit: Payer: Self-pay

## 2020-03-28 NOTE — Anesthesia Preprocedure Evaluation (Addendum)
Anesthesia Evaluation  Patient identified by MRN, date of birth, ID band Patient awake    Reviewed: Allergy & Precautions, NPO status , Patient's Chart, lab work & pertinent test results  Airway Mallampati: IV  TM Distance: >3 FB Neck ROM: Full  Mouth opening: Limited Mouth Opening  Dental  (+) Poor Dentition   Pulmonary shortness of breath and with exertion,    Pulmonary exam normal breath sounds clear to auscultation       Cardiovascular hypertension, Pt. on medications Normal cardiovascular exam Rhythm:Regular Rate:Normal     Neuro/Psych Seizures -, Well Controlled,  PSYCHIATRIC DISORDERS Anxiety Depression ADD Intellectual disability   GI/Hepatic Neg liver ROS, Dental caries   Endo/Other  Morbid obesity  Renal/GU negative Renal ROS  negative genitourinary   Musculoskeletal negative musculoskeletal ROS (+)   Abdominal (+) + obese,   Peds  Hematology negative hematology ROS (+)   Anesthesia Other Findings   Reproductive/Obstetrics                            Anesthesia Physical Anesthesia Plan  ASA: III  Anesthesia Plan: General   Post-op Pain Management:    Induction: Inhalational and Intravenous  PONV Risk Score and Plan: Ondansetron and Treatment may vary due to age or medical condition  Airway Management Planned: Nasal ETT  Additional Equipment:   Intra-op Plan:   Post-operative Plan: Extubation in OR  Informed Consent: I have reviewed the patients History and Physical, chart, labs and discussed the procedure including the risks, benefits and alternatives for the proposed anesthesia with the patient or authorized representative who has indicated his/her understanding and acceptance.     Dental advisory given  Plan Discussed with: CRNA and Surgeon  Anesthesia Plan Comments: (See PAT note written 03/28/2020 by Shonna Chock, PA-C.  Patient was evaluated by  Charolette Child, MD [facility], [facility] - [facility], see Care Everywhere) for preoperative assessment/H&P. He has also sent lab requisitions to be drawn while under anesthesia: CBC with diff, CMET,  A1c, Lipid Profile, Vit D Total, TS H, Free T4, Valproic Acid.   Ketamine dart 300mg  )     Anesthesia Quick Evaluation

## 2020-03-28 NOTE — Progress Notes (Addendum)
I spoke to Georgina Snell, Luke's father. [name] reports that Franky Macho has been in his custody for 8 weeks"; patient had been living with his mother who had to be hospitalized and unable to care for patient. [name] did not get a lot of history information when he over custody of patient, so he just knows that Franky Macho has not had a seizure in the past 8 weeks. [name] has adoption papers, and Joint Custody papers and will bring them in with him.  Franky Macho was seen for a H/P on 03/26/2020 with Dr. Lamar Sprinkles - patients Blood pressure was 185/115, repeat checks 165/89 and 166/100, patient is now on Clonidine. Patient's father has patient on a ;ow sodium diet.  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  Luke tested negative for Covid 03/25/2020 and has been in quarantine with her father and step mother.  Franky Macho will not brush his teeth.

## 2020-03-28 NOTE — Progress Notes (Signed)
Anesthesia Chart Review: Cameron Ibarra   Case: [id number] Date/Time: 03/29/20 0915   Procedure: DENTAL RESTORATION/EXTRACTION WITH X-RAY (N/A )   Anesthesia type: General   Pre-op diagnosis: DENTAL CARIES   [facility] ROOM 04 / [facility]   Surgeons: Joanna Hews, DMD      DISCUSSION: Patient is a 34 year old male scheduled for the above procedure.  History includes never smoker, HTN, absence seizures, ADHD, intellectual disability. At age 48 months he had cranial reshaping surgery (for craniosynostosis??), T&A.  BMI on 03/25/20 was documented as 44.41 at 03/25/20 visit with Dr. Lamar Sprinkles, consistent with morbid obesity.    Notes indicate that he is currently living with his dad. He has previously required sedation for labs due to being uncooperative.  Patient was evaluated by Charolette Child, MD [facility], Pediatric Developmental - [facility], see Care Everywhere) for preoperative assessment/H&P. He has also sent lab requisitions to be drawn while under anesthesia: CBC with diff, CMET,  A1c, Lipid Profile, Vit D Total, TS H, Free T4, Valproic Acid  03/25/2020 preprocedure COVID-19 test negative.  Anesthesia team to evaluate on the day of surgery.   VS:  Blood Pressure 185/115 03/25/2020 2:38 PM EDT   Pulse 104 03/25/2020 2:38 PM EDT   Temperature 37.1 C (98.7 F) 03/25/2020 2:38 PM EDT   Respiratory Rate - -   Oxygen Saturation - -   Inhaled Oxygen Concentration - -   Weight 144.7 kg (319 lb 0.1 oz) 03/25/2020 2:38 PM EDT   Height 180.5 cm (5' 11.06") 03/25/2020 2:38 PM EDT   Body Mass Index 44.41 03/25/2020 2:38 PM EDT    - According to H&P form filled out, follow-up BP 168/89 and 166/100.    PROVIDERS: [facility], [facility]   LABS: See DISCUSSION   EKG: Last EKG seen: Normal EKG on 07/30/13.    CV: N/A    Past Medical History:  Diagnosis Date  . Hypertension   . Mental disorder    add hyperactive  . Seizures     Past Surgical History:  Procedure  Laterality Date  . BRAIN SURGERY     CRANI RESHAPING AGE 74 MONTHS  . DENTAL RESTORATION/EXTRACTION WITH X-RAY N/A 08/06/2013   Procedure: Full mouth x-rays, No extractions,Recall Exam,Full mouth debridement;  Surgeon: Esaw Dace., DDS;  Location: [facility] OR;  Service: Oral Surgery;  Laterality: N/A;  . NO PAST SURGERIES    . TESTICLE SURGERY    . TONSILLECTOMY    . TONSILLECTOMY AND ADENOIDECTOMY     34 YRS  OLD   ONLY ADENOIDS  . TOOTH EXTRACTION  02/22/2012   Procedure: DENTAL RESTORATION/EXTRACTIONS;  Surgeon: Esaw Dace., DDS;  Location: [facility] OR;  Service: Oral Surgery;  Laterality: N/A;   XRAYS AND CLEANING; FILLINGS ON #3, 5, 6, 8, 9, 10 & 12;   CROWNS  # 23, 24, 25    MEDICATIONS: No current facility-administered medications for this encounter.   Marland Kitchen acetaminophen (TYLENOL) 500 MG tablet  . busPIRone (BUSPAR) 10 MG tablet  . cloNIDine (CATAPRES) 0.1 MG tablet  . divalproex (DEPAKOTE) 250 MG DR tablet  . docusate sodium (COLACE) 100 MG capsule  . ibuprofen (ADVIL) 200 MG tablet  . methylphenidate (METADATE ER) 20 MG ER tablet  . methylphenidate (RITALIN) 20 MG tablet  . traZODone (DESYREL) 100 MG tablet    Shonna Chock, PA-C Surgical Short Stay/Anesthesiology [facility] Phone [phone] [facility] Phone [phone] 03/28/2020 12:13 PM

## 2020-03-29 ENCOUNTER — Ambulatory Visit (HOSPITAL_COMMUNITY)
Admission: RE | Admit: 2020-03-29 | Discharge: 2020-03-29 | Disposition: A | Discharge status: Home / Self Care | Payer: Medicare Other | Attending: Dentistry | Admitting: Dentistry

## 2020-03-29 ENCOUNTER — Ambulatory Visit (HOSPITAL_COMMUNITY): Payer: Medicare Other | Admitting: Vascular Surgery

## 2020-03-29 ENCOUNTER — Encounter (HOSPITAL_COMMUNITY): Payer: Self-pay

## 2020-03-29 ENCOUNTER — Encounter (HOSPITAL_COMMUNITY)
Admission: RE | Disposition: A | Discharge status: Home / Self Care | Payer: Self-pay | Source: Home / Self Care | Attending: Dentistry

## 2020-03-29 DIAGNOSIS — F79 Unspecified intellectual disabilities: Secondary | ICD-10-CM | POA: Diagnosis not present

## 2020-03-29 DIAGNOSIS — R569 Unspecified convulsions: Secondary | ICD-10-CM | POA: Insufficient documentation

## 2020-03-29 DIAGNOSIS — F909 Attention-deficit hyperactivity disorder, unspecified type: Secondary | ICD-10-CM | POA: Diagnosis not present

## 2020-03-29 DIAGNOSIS — R0602 Shortness of breath: Secondary | ICD-10-CM | POA: Insufficient documentation

## 2020-03-29 DIAGNOSIS — F419 Anxiety disorder, unspecified: Secondary | ICD-10-CM | POA: Insufficient documentation

## 2020-03-29 DIAGNOSIS — I10 Essential (primary) hypertension: Secondary | ICD-10-CM | POA: Insufficient documentation

## 2020-03-29 DIAGNOSIS — F919 Conduct disorder, unspecified: Secondary | ICD-10-CM | POA: Diagnosis not present

## 2020-03-29 DIAGNOSIS — Z79899 Other long term (current) drug therapy: Secondary | ICD-10-CM | POA: Insufficient documentation

## 2020-03-29 DIAGNOSIS — Z791 Long term (current) use of non-steroidal anti-inflammatories (NSAID): Secondary | ICD-10-CM | POA: Insufficient documentation

## 2020-03-29 DIAGNOSIS — F329 Major depressive disorder, single episode, unspecified: Secondary | ICD-10-CM | POA: Diagnosis not present

## 2020-03-29 DIAGNOSIS — K029 Dental caries, unspecified: Secondary | ICD-10-CM | POA: Diagnosis present

## 2020-03-29 DIAGNOSIS — Z6841 Body Mass Index (BMI) 40.0 and over, adult: Secondary | ICD-10-CM | POA: Insufficient documentation

## 2020-03-29 DIAGNOSIS — Z885 Allergy status to narcotic agent status: Secondary | ICD-10-CM | POA: Diagnosis not present

## 2020-03-29 HISTORY — PX: DENTAL RESTORATION/EXTRACTION WITH X-RAY: SHX5796

## 2020-03-29 HISTORY — DX: Unspecified intellectual disabilities: F79

## 2020-03-29 HISTORY — DX: Disturbances of salivary secretion: K11.7

## 2020-03-29 HISTORY — DX: Other specified behavioral and emotional disorders with onset usually occurring in childhood and adolescence: F98.8

## 2020-03-29 HISTORY — DX: Panic disorder (episodic paroxysmal anxiety): F41.0

## 2020-03-29 HISTORY — DX: Constipation, unspecified: K59.00

## 2020-03-29 HISTORY — DX: Anxiety disorder, unspecified: F41.9

## 2020-03-29 HISTORY — DX: Dyspnea, unspecified: R06.00

## 2020-03-29 HISTORY — DX: Depression, unspecified: F32.A

## 2020-03-29 LAB — CBC WITH DIFFERENTIAL/PLATELET
Abs Immature Granulocytes: 0.02 10*3/uL (ref 0.00–0.07)
Basophils Absolute: 0 10*3/uL (ref 0.0–0.1)
Basophils Relative: 0 %
Eosinophils Absolute: 0.1 10*3/uL (ref 0.0–0.5)
Eosinophils Relative: 1 %
HCT: 42.2 % (ref 39.0–52.0)
Hemoglobin: 14.2 g/dL (ref 13.0–17.0)
Immature Granulocytes: 0 %
Lymphocytes Relative: 32 %
Lymphs Abs: 2.9 10*3/uL (ref 0.7–4.0)
MCH: 30.9 pg (ref 26.0–34.0)
MCHC: 33.6 g/dL (ref 30.0–36.0)
MCV: 91.9 fL (ref 80.0–100.0)
Monocytes Absolute: 0.7 10*3/uL (ref 0.1–1.0)
Monocytes Relative: 8 %
Neutro Abs: 5.3 10*3/uL (ref 1.7–7.7)
Neutrophils Relative %: 59 %
Platelets: 283 10*3/uL (ref 150–400)
RBC: 4.59 MIL/uL (ref 4.22–5.81)
RDW: 12.9 % (ref 11.5–15.5)
WBC: 9 10*3/uL (ref 4.0–10.5)
nRBC: 0 % (ref 0.0–0.2)

## 2020-03-29 LAB — T4, FREE: Free T4: 0.79 ng/dL (ref 0.61–1.12)

## 2020-03-29 LAB — HEMOGLOBIN A1C
Hgb A1c MFr Bld: 5.6 % (ref 4.8–5.6)
Mean Plasma Glucose: 114.02 mg/dL

## 2020-03-29 LAB — LIPID PANEL
Cholesterol: 149 mg/dL (ref 0–200)
HDL: 40 mg/dL — ABNORMAL LOW (ref >40)
LDL Cholesterol: 78 mg/dL (ref 0–99)
Total CHOL/HDL Ratio: 3.7 RATIO
Triglycerides: 156 mg/dL — ABNORMAL HIGH (ref <150)
VLDL: 31 mg/dL (ref 0–40)

## 2020-03-29 LAB — COMPREHENSIVE METABOLIC PANEL
ALT: 28 U/L (ref 0–44)
AST: 34 U/L (ref 15–41)
Albumin: 3.8 g/dL (ref 3.5–5.0)
Alkaline Phosphatase: 48 U/L (ref 38–126)
Anion gap: 10 (ref 5–15)
BUN: 14 mg/dL (ref 6–20)
CO2: 27 mmol/L (ref 22–32)
Calcium: 9.5 mg/dL (ref 8.9–10.3)
Chloride: 103 mmol/L (ref 98–111)
Creatinine, Ser: 0.9 mg/dL (ref 0.61–1.24)
GFR calc Af Amer: >60 mL/min (ref >60)
GFR calc non Af Amer: >60 mL/min (ref >60)
Glucose, Bld: 94 mg/dL (ref 70–99)
Potassium: 4.1 mmol/L (ref 3.5–5.1)
Sodium: 140 mmol/L (ref 135–145)
Total Bilirubin: 0.7 mg/dL (ref 0.3–1.2)
Total Protein: 7.4 g/dL (ref 6.5–8.1)

## 2020-03-29 LAB — VALPROIC ACID LEVEL: Valproic Acid Lvl: 90 ug/mL (ref 50.0–100.0)

## 2020-03-29 LAB — VITAMIN D 25 HYDROXY (VIT D DEFICIENCY, FRACTURES): Vit D, 25-Hydroxy: 27.89 ng/mL — ABNORMAL LOW (ref 30–100)

## 2020-03-29 LAB — TSH: TSH: 4.142 u[IU]/mL (ref 0.350–4.500)

## 2020-03-29 SURGERY — DENTAL RESTORATION/EXTRACTION WITH X-RAY
Anesthesia: General

## 2020-03-29 MED ORDER — MIDAZOLAM HCL 2 MG/2ML IJ SOLN
INTRAMUSCULAR | Status: AC
  Filled 2020-03-29: qty 2

## 2020-03-29 MED ORDER — KETAMINE HCL 100 MG/ML IJ SOLN
INTRAMUSCULAR | Status: AC
  Filled 2020-03-29: qty 1

## 2020-03-29 MED ORDER — LIDOCAINE 2% (20 MG/ML) 5 ML SYRINGE: INTRAMUSCULAR | Status: DC | PRN

## 2020-03-29 MED ORDER — LIDOCAINE-EPINEPHRINE 2 %-1:100000 IJ SOLN
INTRAMUSCULAR | Status: AC
  Filled 2020-03-29: qty 3.4

## 2020-03-29 MED ORDER — ACETAMINOPHEN 10 MG/ML IV SOLN
INTRAVENOUS | Status: AC
  Filled 2020-03-29: qty 100

## 2020-03-29 MED ORDER — PROPOFOL 10 MG/ML IV BOLUS
INTRAVENOUS | Status: AC
  Filled 2020-03-29: qty 20

## 2020-03-29 MED ORDER — LACTATED RINGERS IV SOLN: INTRAVENOUS | Status: DC | PRN

## 2020-03-29 MED ORDER — MIDAZOLAM HCL 2 MG/2ML IJ SOLN
INTRAMUSCULAR | Status: AC
  Filled 2020-03-29: qty 4

## 2020-03-29 MED ORDER — CHLORHEXIDINE GLUCONATE 0.12 % MT SOLN
OROMUCOSAL | Status: AC
  Filled 2020-03-29: qty 15

## 2020-03-29 MED ORDER — KETAMINE HCL 50 MG/ML IJ SOLN
400.0000 mg | Freq: Once | INTRAMUSCULAR | Status: DC
  Filled 2020-03-29: qty 8

## 2020-03-29 MED ORDER — ONDANSETRON HCL 4 MG/2ML IJ SOLN
INTRAMUSCULAR | Status: DC | PRN
  Administered 2020-03-29: 4 mg via INTRAVENOUS

## 2020-03-29 MED ORDER — LIDOCAINE HCL 2 % IJ SOLN
INTRAMUSCULAR | Status: DC | PRN
  Administered 2020-03-29: 13:00:00 5 mL via SURGICAL_CAVITY

## 2020-03-29 MED ORDER — ROCURONIUM BROMIDE 50 MG/5ML IV SOSY
PREFILLED_SYRINGE | INTRAVENOUS | Status: DC | PRN
  Administered 2020-03-29: 40 mg via INTRAVENOUS
  Administered 2020-03-29: 20 mg via INTRAVENOUS
  Administered 2020-03-29: 60 mg via INTRAVENOUS
  Administered 2020-03-29: 50 mg via INTRAVENOUS

## 2020-03-29 MED ORDER — OXYMETAZOLINE HCL 0.05 % NA SOLN
NASAL | Status: AC
  Filled 2020-03-29: qty 30

## 2020-03-29 MED ORDER — FENTANYL CITRATE (PF) 250 MCG/5ML IJ SOLN
INTRAMUSCULAR | Status: AC
  Filled 2020-03-29: qty 5

## 2020-03-29 MED ORDER — CEFAZOLIN SODIUM-DEXTROSE 2-3 GM-%(50ML) IV SOLR
INTRAVENOUS | Status: DC | PRN
Start: 2020-03-29 — End: 2020-03-29
  Administered 2020-03-29: 2 g via INTRAVENOUS

## 2020-03-29 MED ORDER — KETOROLAC TROMETHAMINE 30 MG/ML IJ SOLN
INTRAMUSCULAR | Status: DC | PRN
Start: 2020-03-29 — End: 2020-03-29
  Administered 2020-03-29: 30 mg via INTRAVENOUS

## 2020-03-29 MED ORDER — CHLORHEXIDINE GLUCONATE 0.12 % MT SOLN: OROMUCOSAL | Status: DC | PRN

## 2020-03-29 MED ORDER — ACETAMINOPHEN 10 MG/ML IV SOLN
1000.0000 mg | Freq: Once | INTRAVENOUS | Status: DC | PRN
  Administered 2020-03-29: 14:00:00 1000 mg via INTRAVENOUS

## 2020-03-29 MED ORDER — MIDAZOLAM HCL 2 MG/2ML IJ SOLN: 2.0000 mg | Freq: Once | INTRAMUSCULAR | Status: DC | PRN

## 2020-03-29 MED ORDER — PROMETHAZINE HCL 25 MG/ML IJ SOLN
6.2500 mg | INTRAMUSCULAR | Status: DC | PRN
  Administered 2020-03-29: 14:00:00 6.25 mg via INTRAVENOUS

## 2020-03-29 MED ORDER — PROMETHAZINE HCL 25 MG/ML IJ SOLN
INTRAMUSCULAR | Status: AC
  Filled 2020-03-29: qty 1

## 2020-03-29 MED ORDER — DEXMEDETOMIDINE HCL 200 MCG/2ML IV SOLN
INTRAVENOUS | Status: DC | PRN
  Administered 2020-03-29: 8 ug via INTRAVENOUS
  Administered 2020-03-29: 20 ug via INTRAVENOUS

## 2020-03-29 MED ORDER — PROPOFOL 10 MG/ML IV BOLUS
INTRAVENOUS | Status: DC | PRN
  Administered 2020-03-29: 200 mg via INTRAVENOUS

## 2020-03-29 MED ORDER — FENTANYL CITRATE (PF) 100 MCG/2ML IJ SOLN
INTRAMUSCULAR | Status: DC | PRN
  Administered 2020-03-29: 100 ug via INTRAVENOUS
  Administered 2020-03-29: 250 ug via INTRAVENOUS
  Administered 2020-03-29: 100 ug via INTRAVENOUS
  Administered 2020-03-29: 50 ug via INTRAVENOUS

## 2020-03-29 MED ORDER — DEXAMETHASONE SODIUM PHOSPHATE 10 MG/ML IJ SOLN
INTRAMUSCULAR | Status: DC | PRN
  Administered 2020-03-29: 10 mg via INTRAVENOUS

## 2020-03-29 MED ORDER — SUGAMMADEX SODIUM 200 MG/2ML IV SOLN
INTRAVENOUS | Status: DC | PRN
  Administered 2020-03-29: 200 mg via INTRAVENOUS

## 2020-03-29 SURGICAL SUPPLY — 22 items
BLADE SURG 15 STRL LF DISP TIS (BLADE) ×1 IMPLANT
BLADE SURG 15 STRL SS (BLADE) ×2
CANISTER SUCT 3000ML PPV (MISCELLANEOUS) ×2 IMPLANT
COVER BACK TABLE 60X90IN (DRAPES) ×2 IMPLANT
COVER MAYO STAND STRL (DRAPES) ×2 IMPLANT
COVER SURGICAL LIGHT HANDLE (MISCELLANEOUS) ×2 IMPLANT
DRAPE HALF SHEET 40X57 (DRAPES) ×2 IMPLANT
GAUZE PACKING FOLDED 1IN STRL (GAUZE/BANDAGES/DRESSINGS) ×1 IMPLANT
GLOVE BIO SURGEON STRL SZ 6.5 (GLOVE) ×2 IMPLANT
GOWN STRL REUS W/ TWL LRG LVL3 (GOWN DISPOSABLE) ×2 IMPLANT
GOWN STRL REUS W/TWL LRG LVL3 (GOWN DISPOSABLE) ×4
KIT BASIN OR (CUSTOM PROCEDURE TRAY) ×2 IMPLANT
KIT TURNOVER KIT B (KITS) ×2 IMPLANT
NDL DENTAL 27 LONG (NEEDLE) ×1 IMPLANT
NEEDLE DENTAL 27 LONG (NEEDLE) ×2 IMPLANT
PAD ARMBOARD 7.5X6 YLW CONV (MISCELLANEOUS) ×4 IMPLANT
SPONGE SURGIFOAM ABS GEL SZ50 (HEMOSTASIS) ×1 IMPLANT
SUT CHROMIC 3 0 PS 2 (SUTURE) ×2 IMPLANT
TOWEL GREEN STERILE (TOWEL DISPOSABLE) ×2 IMPLANT
TUBE CONNECTING 12X1/4 (SUCTIONS) ×2 IMPLANT
WATER TABLETS ICX (MISCELLANEOUS) ×2 IMPLANT
YANKAUER SUCT BULB TIP NO VENT (SUCTIONS) ×2 IMPLANT

## 2020-03-29 NOTE — Transfer of Care (Signed)
Immediate Anesthesia Transfer of Care Note  Patient: Cameron Ibarra  Procedure(s) Performed: DENTAL RESTORATION/EXTRACTION WITH X-RAY (N/A )  Patient Location: PACU  Anesthesia Type:General  Level of Consciousness: awake, alert  and patient cooperative  Airway & Oxygen Therapy: Patient Spontanous Breathing and Patient connected to face mask oxygen  Post-op Assessment: Report given to RN and Post -op Vital signs reviewed and stable  Post vital signs: Reviewed and stable  Last Vitals:  Vitals Value Taken Time  BP 175/104 03/29/20 1344  Temp    Pulse 109 03/29/20 1344  Resp 18 03/29/20 1344  SpO2 95 % 03/29/20 1344  Vitals shown include unvalidated device data.  Last Pain:  Vitals:   03/29/20 0759  TempSrc:   PainSc: 5          Complications: No apparent anesthesia complications

## 2020-03-29 NOTE — H&P (Signed)
H&P reviewed. Stable for surgery Cameron Ibarra, DMD  

## 2020-03-29 NOTE — Anesthesia Procedure Notes (Signed)
Procedure Name: Intubation Date/Time: 03/29/2020 9:35 AM Performed by: Sheppard Evens, CRNA Pre-anesthesia Checklist: Patient identified, Emergency Drugs available, Suction available and Patient being monitored Patient Re-evaluated:Patient Re-evaluated prior to induction Oxygen Delivery Method: Circle System Utilized Preoxygenation: Pre-oxygenation with 100% oxygen Induction Type: IV induction Ventilation: Mask ventilation without difficulty Laryngoscope Size: Glidescope and 4 Grade View: Grade I Nasal Tubes: Right and Nasal Rae Tube size: 7.5 mm Number of attempts: 1 Airway Equipment and Method: Stylet and Oral airway Placement Confirmation: ETT inserted through vocal cords under direct vision,  positive ETCO2 and breath sounds checked- equal and bilateral Tube secured with: Tape Dental Injury: Teeth and Oropharynx as per pre-operative assessment

## 2020-03-29 NOTE — Brief Op Note (Signed)
03/29/2020  1:09 PM  PATIENT:  Kaylyn Layer  34 y.o. male  PRE-OPERATIVE DIAGNOSIS:  DENTAL CARIES  POST-OPERATIVE DIAGNOSIS:  DENTAL CARIES  PROCEDURE:  Full mouth xrays, exam, full mouth debridement, glass ionomer restorations on #5DO, 14DOB, 29DF, 60M, 33 MOB, extractions on #6,7,8,9,19,23,24,25,26,30  SURGEON:  Surgeon(s) and Role:    * Ralene Gasparyan H, DMD - Primary  PHYSICIAN ASSISTANT:   ASSISTANTS: Terance Ice    ANESTHESIA:   general  EBL:  50 mL   BLOOD ADMINISTERED:none  DRAINS: none   LOCAL MEDICATIONS USED:  LIDOCAINE 34ml  SPECIMEN:  No Specimen  DISPOSITION OF SPECIMEN:  N/A  COUNTS:  YES  TOURNIQUET:  * No tourniquets in log *  DICTATION: .Dragon Dictation  PLAN OF CARE: Discharge to home after PACU  PATIENT DISPOSITION:  PACU - hemodynamically stable.   Delay start of Pharmacological VTE agent (>24hrs) due to surgical blood loss or risk of bleeding: not applicable

## 2020-03-29 NOTE — H&P (Signed)
Anesthesia H&P Update: History and Physical Exam reviewed; patient is OK for planned anesthetic and procedure. ? ?

## 2020-03-29 NOTE — Op Note (Signed)
[facility]  03/29/2020 [name] [id number]  Preop DX: Dental caries/behavior management issues due to intellectual disabilities/developmental disabilities. Dental Care provided in OR for medically necessary treatment.  Surgeon: Joanna Hews, DMD  Assistant: Terance Ice and hospital staff.  Anesthesia: General  Procedure: The patient was brought into the operating room and placed on the table in a supine position.  General anesthesia was administered via nasal intubation.  The patient was prepped and draped in the usual manner for an intra-oral general dentistry procedure. The oropharynx was suctioned and a moistened oropharyngeal throat pack was placed.    A full intra-oral exam including all hard and soft tissues was performed.  Type of Exam: Recall   Soft Tissue Exam: Floor of the mouth: Normal Buccal mucosa: Normal Soft palate: Normal Hard palate: Normal Tongue: Normal Gingival: Normal Frenum: Normal  Hard tissue exam:  Present: # 3-9, 11-14, 19, 23-27, 29-31 Missing: # 1,2,10,15,16,17,18,20,21,28, 32 Radiographic findings decay: # 5, 6,7,8,9, 12,19,26,27,29,30,31 Radiographic findings abscess: # 24  Full mouth series of digital radiographs taken and reviewed. A comprehensive treatment plan was developed.  Operative care was accomplished in a standard fashion using high/low speed drills with copious irrigation.  Routine extractions were accomplished with simple elevation and use of forceps. Surgical Extractions were done in a standard fashion with full facial thickness flaps to gain access, otectomy / osteoplasty with copious irrigation to expose the teeth. Teeth with multi-roots were sectioned as needed to minimize surgical trauma.   All surgical sites were irrigated with copious amounts of saline. Gel foam was placed in the sockets and hemostasis established with firm pressure. Surgical sites were closed with 3-0 Chromic sutures.  Local Anes:Lidocaine 2%  with 1:100,000 epinephrine 5 mls The estimated blood loss was 50 mls.   Upon completion of all procedures the oropharynx was irrigated of all debris. Mouth was suctioned dry and a posterior throat pack was carefully removed with constant suction. Hemostasis was established and a gauze pack was placed as an intraoral pressure dressing. After spontaneous respirations the patient was extubated and transported to the Post-Anesthesia care unit in awake but in a sedated condition. The patient tolerated the procedure well and without complications.  An explanation of procedures and extractions were given to [name] (father).  Operative Procedures:  Full mouth debridement: Yes Extractions completed: # 6,7,8,9,23,24,25,26,19,30 Composites/Glass Ionomers: # 5DO (coronal/chewing/dentin), 12 DOB(coronal/chewing/dentin), 27DF (coronal/smooth/dentin), 10 M (coronal/smooth/dentin), (coronal/chewing/dentin) Fluoride varnish: Yes  Postoperative Meds:  OTC tylenol 500mg  and ibuprofen 600mg  every 6 hours prn pain.  Postoperative Instructions: Extraction sheet signed and given to (father)    , DMD

## 2020-03-29 NOTE — Anesthesia Postprocedure Evaluation (Signed)
Anesthesia Post Note  Patient: Cameron Ibarra  Procedure(s) Performed: DENTAL RESTORATION/EXTRACTION WITH X-RAY (N/A )     Patient location during evaluation: PACU Anesthesia Type: General Level of consciousness: awake and alert Pain management: pain level controlled Vital Signs Assessment: post-procedure vital signs reviewed and stable Respiratory status: spontaneous breathing, nonlabored ventilation and respiratory function stable Cardiovascular status: blood pressure returned to baseline and stable Postop Assessment: no apparent nausea or vomiting Anesthetic complications: no    Last Vitals:  Vitals:   03/29/20 1400 03/29/20 1404  BP: (!) 151/93 (!) 152/96  Pulse: 93 91  Resp: 11 16  Temp:  36.5 C  SpO2: 90% 93%    Last Pain:  Vitals:   03/29/20 0759  TempSrc:   PainSc: 5                  Leara Rawl A.
